# Patient Record
Sex: Female | Born: 1975 | Race: Black or African American | Hispanic: No | Smoking: Never smoker
Health system: Southern US, Community
[De-identification: ages and names within clinical notes are randomized; demographics above are authoritative.]

## PROBLEM LIST (undated history)

## (undated) DIAGNOSIS — D649 Anemia, unspecified: Secondary | ICD-10-CM

## (undated) HISTORY — PX: DILATION AND CURETTAGE, DIAGNOSTIC / THERAPEUTIC: SUR384

## (undated) HISTORY — DX: Anemia, unspecified: D64.9

---

## 2003-03-20 HISTORY — PX: TUBAL LIGATION: SHX77

## 2005-03-19 ENCOUNTER — Encounter (INDEPENDENT_AMBULATORY_CARE_PROVIDER_SITE_OTHER): Payer: Self-pay | Admitting: Internal Medicine

## 2005-03-19 LAB — CONVERTED CEMR LAB

## 2005-05-26 ENCOUNTER — Inpatient Hospital Stay (HOSPITAL_COMMUNITY): Admission: AD | Admit: 2005-05-26 | Discharge: 2005-05-26 | Payer: Self-pay | Admitting: Obstetrics and Gynecology

## 2005-05-29 ENCOUNTER — Inpatient Hospital Stay (HOSPITAL_COMMUNITY): Admission: AD | Admit: 2005-05-29 | Discharge: 2005-05-29 | Payer: Self-pay | Admitting: Obstetrics and Gynecology

## 2005-11-02 ENCOUNTER — Inpatient Hospital Stay (HOSPITAL_COMMUNITY): Admission: AD | Admit: 2005-11-02 | Discharge: 2005-11-02 | Payer: Self-pay | Admitting: Family Medicine

## 2006-04-03 ENCOUNTER — Emergency Department (HOSPITAL_COMMUNITY): Admission: EM | Admit: 2006-04-03 | Discharge: 2006-04-03 | Payer: Self-pay | Admitting: Emergency Medicine

## 2006-05-31 ENCOUNTER — Ambulatory Visit: Payer: Self-pay | Admitting: Internal Medicine

## 2006-06-14 ENCOUNTER — Ambulatory Visit: Payer: Self-pay | Admitting: Internal Medicine

## 2006-07-15 ENCOUNTER — Ambulatory Visit: Payer: Self-pay | Admitting: Internal Medicine

## 2006-09-24 ENCOUNTER — Inpatient Hospital Stay (HOSPITAL_COMMUNITY): Admission: AD | Admit: 2006-09-24 | Discharge: 2006-09-24 | Payer: Self-pay | Admitting: Obstetrics & Gynecology

## 2006-09-27 ENCOUNTER — Ambulatory Visit: Payer: Self-pay | Admitting: Internal Medicine

## 2006-11-27 ENCOUNTER — Encounter (INDEPENDENT_AMBULATORY_CARE_PROVIDER_SITE_OTHER): Payer: Self-pay | Admitting: Internal Medicine

## 2006-11-27 DIAGNOSIS — G44209 Tension-type headache, unspecified, not intractable: Secondary | ICD-10-CM

## 2006-11-27 DIAGNOSIS — N760 Acute vaginitis: Secondary | ICD-10-CM | POA: Insufficient documentation

## 2006-11-27 DIAGNOSIS — N920 Excessive and frequent menstruation with regular cycle: Secondary | ICD-10-CM | POA: Insufficient documentation

## 2006-11-27 DIAGNOSIS — Z8619 Personal history of other infectious and parasitic diseases: Secondary | ICD-10-CM | POA: Insufficient documentation

## 2006-11-27 DIAGNOSIS — N946 Dysmenorrhea, unspecified: Secondary | ICD-10-CM | POA: Insufficient documentation

## 2007-01-03 ENCOUNTER — Telehealth (INDEPENDENT_AMBULATORY_CARE_PROVIDER_SITE_OTHER): Payer: Self-pay | Admitting: *Deleted

## 2007-09-10 ENCOUNTER — Inpatient Hospital Stay (HOSPITAL_COMMUNITY): Admission: AD | Admit: 2007-09-10 | Discharge: 2007-09-10 | Payer: Self-pay | Admitting: Obstetrics & Gynecology

## 2009-02-17 ENCOUNTER — Emergency Department (HOSPITAL_COMMUNITY): Admission: EM | Admit: 2009-02-17 | Discharge: 2009-02-17 | Payer: Self-pay | Admitting: Emergency Medicine

## 2009-06-23 IMAGING — US US PELVIS COMPLETE MODIFY
1 series · 13 of 25 positions shown · non-contrast
Comparison: none

CLINICAL DATA: Left lower quadrant pain and back pain. 
 TRANSABDOMINAL AND TRANSVAGINAL PELVIC ULTRASOUND:
TECHNIQUE: Both transabdominal and transvaginal ultrasound examinations of the pelvis were performed including evaluation of the uterus, ovaries, adnexal regions, and pelvic cul-de-sac.
TECHNIQUE: Complete ultrasound examination of the urinary tract was performed including evaluation of the kidneys, renal collecting systems, and urinary bladder.

[Series 1: us pelvis complete modify · 0.16mm/px · 13 of 30 slices shown]
[im 1/30]
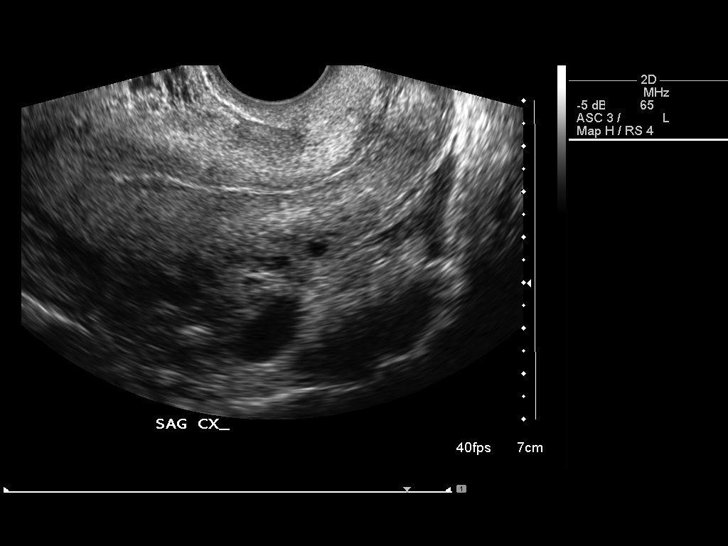
[im 3/30]
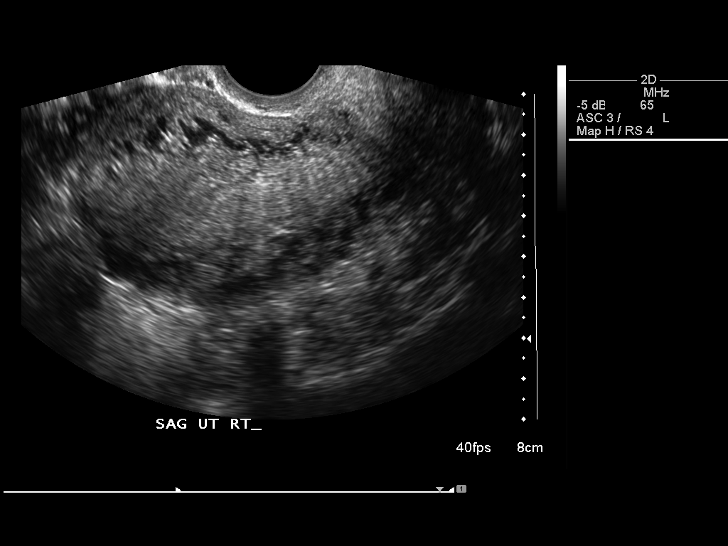
[im 5/30]
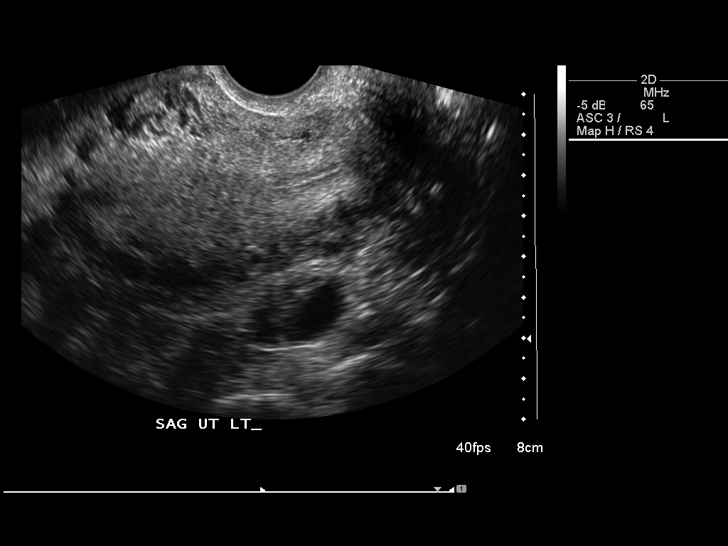
[im 8/30]
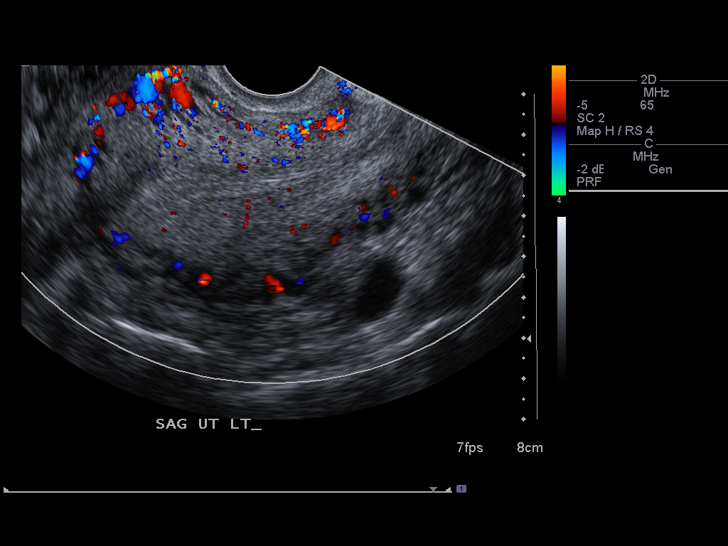
[im 10/30]
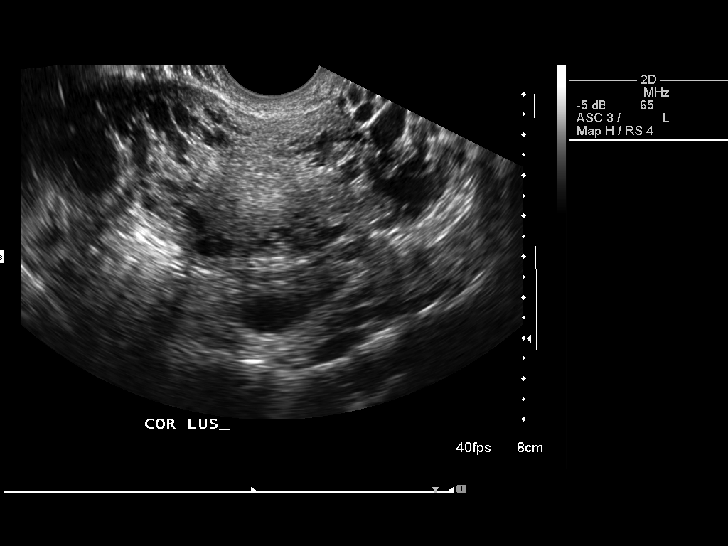
[im 13/30]
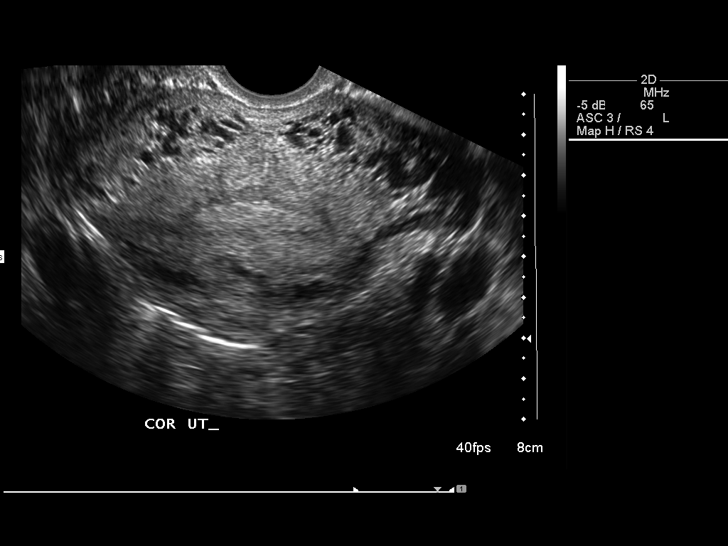
[im 15/30]
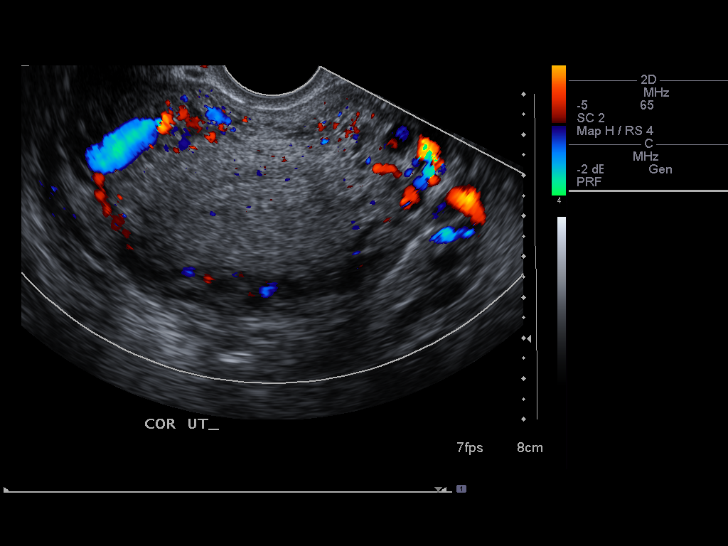
[im 17/30]
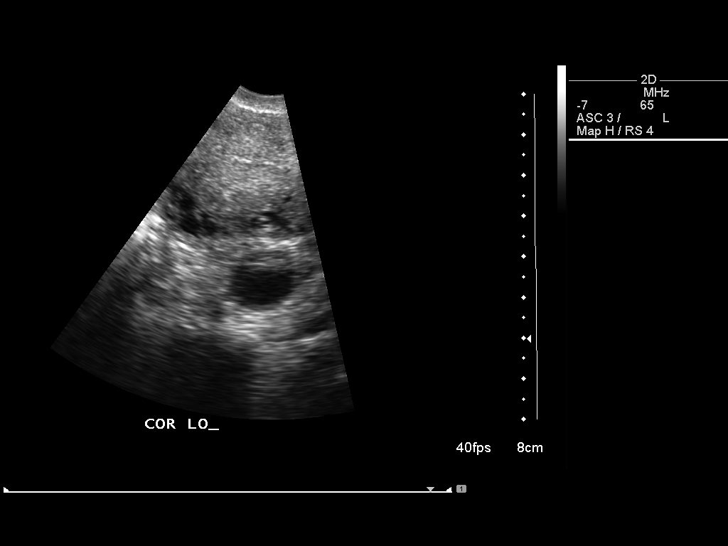
[im 20/30]
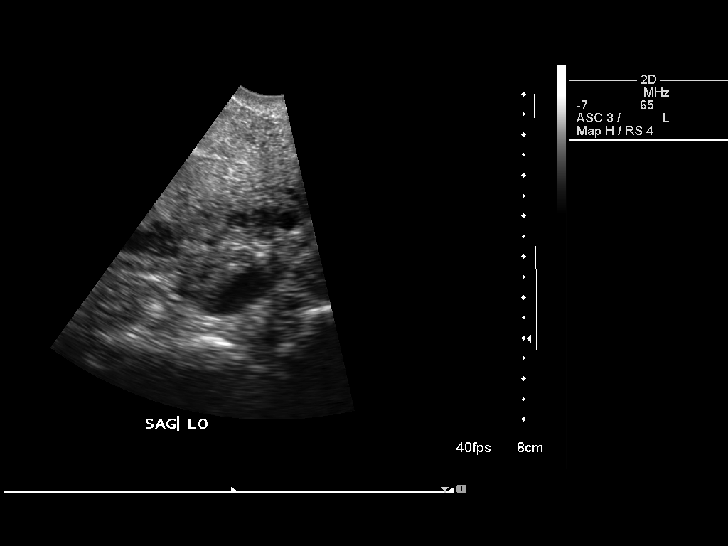
[im 22/30]
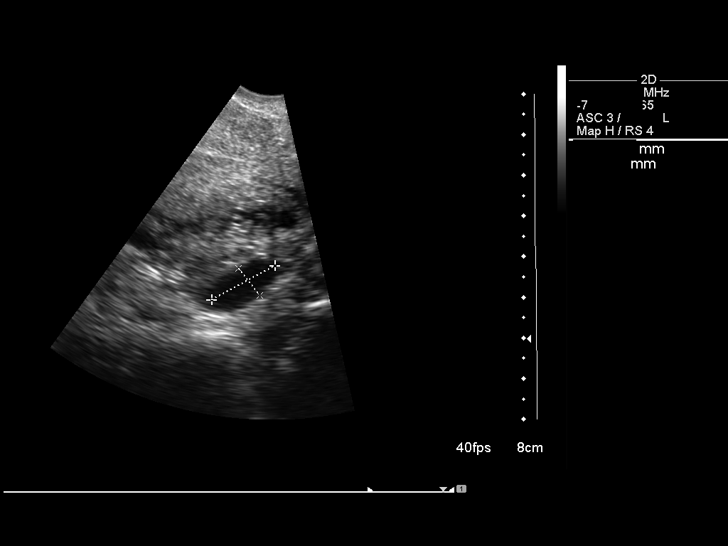
[im 25/30]
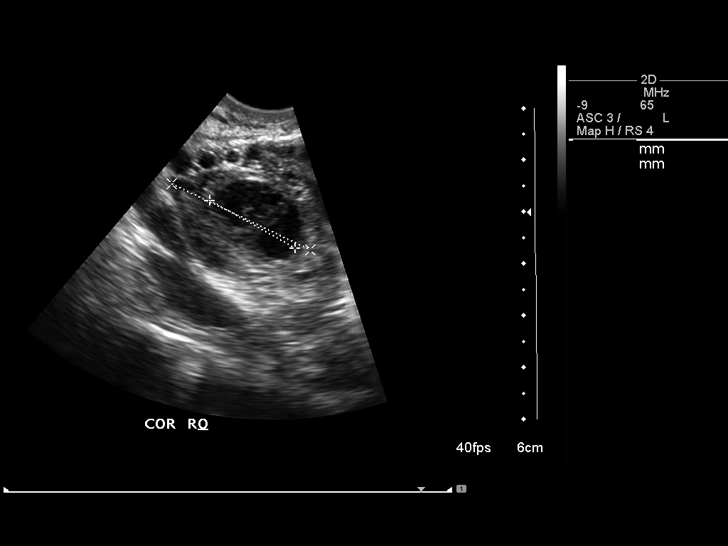
[im 27/30]
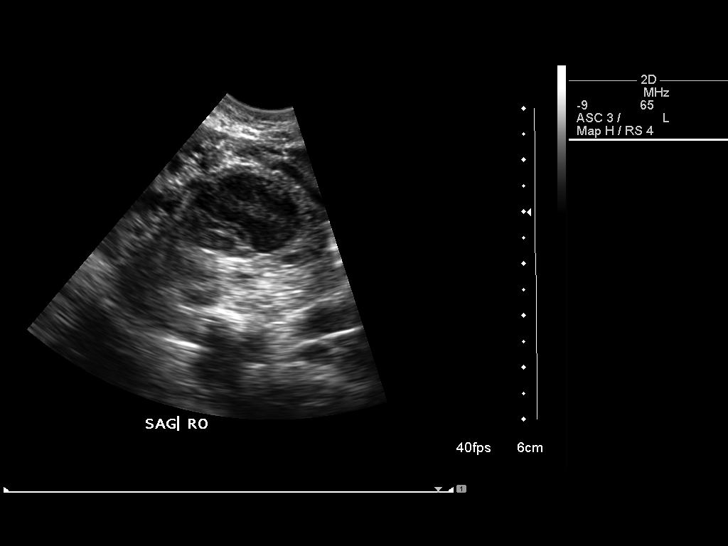
[im 30/30]
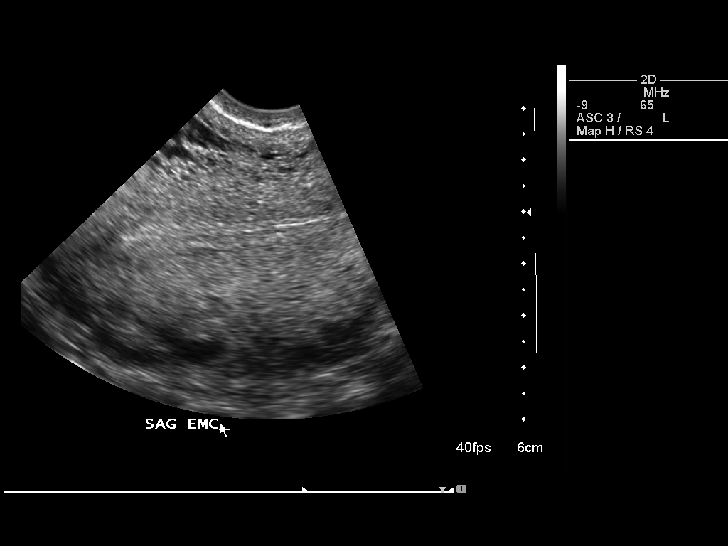

[13 of 25 positions shown; findings below may reference images not displayed]

FINDINGS: The uterus has a normal size, shape, and echotexture. The uterine dimensions are 11.1 x 6.4 x 8.4 cm.  The endometrium is homogeneous and within normal limits in width measuring 10-11 mm.  Both ovaries have a normal size, shape, and appearance. The right ovary measures 3.8 x 2.4 x 3.0 cm, and the left ovary measures 2.4 x 1.6 x 1.8 cm.  A 2 cm hemorrhagic cyst is within normal limits on the right ovary. There is no free pelvic fluid.
IMPRESSION: Normal pelvic ultrasound. 
 RENAL/URINARY TRACT ULTRASOUND:
FINDINGS: Both kidneys have a normal size, shape, and echotexture. No shadowing renal calculi, hydronephrosis, or renal masses are identified bilaterally.  The right kidney measures 10.9 cm in length and the left kidney measures 11.0 cm in length. The urinary bladder has an unremarkable sonographic appearance.
IMPRESSION: Normal renal ultrasound.

## 2010-12-14 LAB — POCT PREGNANCY, URINE
Operator id: 28886
Preg Test, Ur: NEGATIVE

## 2010-12-14 LAB — URINALYSIS, ROUTINE W REFLEX MICROSCOPIC
Nitrite: NEGATIVE
Protein, ur: 30 — AB
Specific Gravity, Urine: 1.02

## 2010-12-14 LAB — URINE MICROSCOPIC-ADD ON

## 2011-01-02 LAB — WET PREP, GENITAL: Yeast Wet Prep HPF POC: NONE SEEN

## 2011-01-02 LAB — COMPREHENSIVE METABOLIC PANEL
ALT: 16
AST: 21
Albumin: 3.8
CO2: 27
GFR calc non Af Amer: >60
Glucose, Bld: 110 — ABNORMAL HIGH
Sodium: 137

## 2011-01-02 LAB — URINALYSIS, ROUTINE W REFLEX MICROSCOPIC
Hgb urine dipstick: NEGATIVE
Nitrite: NEGATIVE
Protein, ur: NEGATIVE
Specific Gravity, Urine: 1.025
pH: 6

## 2011-01-02 LAB — POCT PREGNANCY, URINE
Operator id: 220991
Preg Test, Ur: NEGATIVE

## 2011-01-02 LAB — CBC
HCT: 35.3 — ABNORMAL LOW
MCHC: 33.3
RDW: 13.7

## 2011-05-27 ENCOUNTER — Emergency Department (HOSPITAL_COMMUNITY)
Admission: EM | Admit: 2011-05-27 | Discharge: 2011-05-27 | Disposition: A | Discharge status: Home / Self Care | Payer: Self-pay | Attending: Emergency Medicine | Admitting: Emergency Medicine

## 2011-05-27 ENCOUNTER — Encounter (HOSPITAL_COMMUNITY): Payer: Self-pay | Admitting: Emergency Medicine

## 2011-05-27 DIAGNOSIS — R350 Frequency of micturition: Secondary | ICD-10-CM | POA: Insufficient documentation

## 2011-05-27 DIAGNOSIS — N390 Urinary tract infection, site not specified: Secondary | ICD-10-CM | POA: Insufficient documentation

## 2011-05-27 DIAGNOSIS — R300 Dysuria: Secondary | ICD-10-CM | POA: Insufficient documentation

## 2011-05-27 LAB — URINALYSIS, ROUTINE W REFLEX MICROSCOPIC
Bilirubin Urine: NEGATIVE
Glucose, UA: NEGATIVE mg/dL
Ketones, ur: NEGATIVE mg/dL
Protein, ur: 30 mg/dL — AB
Specific Gravity, Urine: 1.025 (ref 1.005–1.030)
pH: 6 (ref 5.0–8.0)

## 2011-05-27 LAB — URINE MICROSCOPIC-ADD ON

## 2011-05-27 LAB — POCT PREGNANCY, URINE: Preg Test, Ur: NEGATIVE

## 2011-05-27 MED ORDER — IBUPROFEN 800 MG PO TABS
800.0000 mg | ORAL_TABLET | Freq: Once | ORAL | Status: AC
  Administered 2011-05-27: 800 mg via ORAL
  Filled 2011-05-27: qty 1

## 2011-05-27 MED ORDER — CEFIXIME 400 MG PO TABS
400.0000 mg | ORAL_TABLET | Freq: Once | ORAL | Status: AC
  Administered 2011-05-27: 400 mg via ORAL
  Filled 2011-05-27: qty 1

## 2011-05-27 MED ORDER — PHENAZOPYRIDINE HCL 100 MG PO TABS
100.0000 mg | ORAL_TABLET | Freq: Once | ORAL | Status: AC
  Administered 2011-05-27: 100 mg via ORAL
  Filled 2011-05-27: qty 1

## 2011-05-27 MED ORDER — CEPHALEXIN 500 MG PO CAPS: 500.0000 mg | ORAL_CAPSULE | Freq: Four times a day (QID) | ORAL | Status: AC

## 2011-05-27 MED ORDER — ONDANSETRON HCL 4 MG PO TABS
4.0000 mg | ORAL_TABLET | Freq: Once | ORAL | Status: AC
  Administered 2011-05-27: 4 mg via ORAL
  Filled 2011-05-27: qty 1

## 2011-05-27 NOTE — ED Notes (Signed)
Patient with no complaints at this time. Respirations even and unlabored. Skin warm/dry. Discharge instructions reviewed with patient at this time. Patient given opportunity to voice concerns/ask questions. Patient discharged at this time and left Emergency Department with steady gait.   

## 2011-05-27 NOTE — ED Notes (Signed)
Patient with c/o urinary pain and frequency for past several days. Denies abdominal pain.

## 2011-05-27 NOTE — ED Provider Notes (Signed)
History     CSN: [id number]  Arrival date & time 05/27/11  0909   First MD Initiated Contact with Patient 05/27/11 [id number]      Chief Complaint  Patient presents with  . Urinary Frequency  . Dysuria    (Consider location/radiation/quality/duration/timing/severity/associated sxs/prior treatment) Patient is a 36 y.o. female presenting with frequency and dysuria. The history is provided by the patient.  Urinary Frequency This is a new problem. The current episode started in the past 7 days. The problem occurs daily. The problem has been gradually worsening. Pertinent negatives include no abdominal pain, arthralgias, chest pain, chills, coughing, fever, neck pain or vomiting. Associated symptoms comments: dysuria. The symptoms are aggravated by nothing. Treatments tried: azo standard. The treatment provided no relief.  Dysuria  Associated symptoms include frequency. Pertinent negatives include no chills, no vomiting and no hematuria.    History reviewed. No pertinent past medical history.  Past Surgical History  Procedure Date  . Dilation and curettage, diagnostic / therapeutic     No family history on file.  History  Substance Use Topics  . Smoking status: Current Some Day Smoker    Types: Cigarettes  . Smokeless tobacco: Not on file  . Alcohol Use: No    OB History    Grav Para Term Preterm Abortions TAB SAB Ect Mult Living                  Review of Systems  Constitutional: Negative for fever, chills and activity change.       All ROS Neg except as noted in HPI  HENT: Negative for nosebleeds and neck pain.   Eyes: Negative for photophobia and discharge.  Respiratory: Negative for cough, shortness of breath and wheezing.   Cardiovascular: Negative for chest pain and palpitations.  Gastrointestinal: Negative for vomiting, abdominal pain and blood in stool.  Genitourinary: Positive for dysuria and frequency. Negative for hematuria.  Musculoskeletal: Negative for back  pain and arthralgias.  Skin: Negative.   Neurological: Negative for dizziness, seizures and speech difficulty.  Psychiatric/Behavioral: Negative for hallucinations and confusion.    Allergies  Review of patient's allergies indicates no known allergies.  Home Medications   Current Outpatient Rx  Name Route Sig Dispense Refill  . IBUPROFEN 200 MG PO TABS Oral Take 400 mg by mouth every 6 (six) hours as needed. For pain    . CEPHALEXIN 500 MG PO CAPS Oral Take 1 capsule (500 mg total) by mouth 4 (four) times daily. 28 capsule 0    BP 111/73  Pulse 81  Temp(Src) 97.9 F (36.6 C) (Oral)  Resp 16  Ht 5\' 9"  (1.753 m)  Wt 143 lb (64.864 kg)  BMI 21.12 kg/m2  SpO2 99%  LMP 04/27/2011  Physical Exam  Nursing note and vitals reviewed. Constitutional: She is oriented to person, place, and time. She appears well-developed and well-nourished.  Non-toxic appearance.  HENT:  Head: Normocephalic.  Right Ear: Tympanic membrane and external ear normal.  Left Ear: Tympanic membrane and external ear normal.  Eyes: EOM and lids are normal. Pupils are equal, round, and reactive to light.  Neck: Normal range of motion. Neck supple. Carotid bruit is not present.  Cardiovascular: Normal rate, regular rhythm, normal heart sounds, intact distal pulses and normal pulses.   Pulmonary/Chest: Breath sounds normal. No respiratory distress.  Abdominal: Soft. Bowel sounds are normal. There is no tenderness. There is no guarding.       No CV A tenderness.  Musculoskeletal: Normal  range of motion.  Lymphadenopathy:       Head (right side): No submandibular adenopathy present.       Head (left side): No submandibular adenopathy present.    She has no cervical adenopathy.  Neurological: She is alert and oriented to person, place, and time. She has normal strength. No cranial nerve deficit or sensory deficit.  Skin: Skin is warm and dry.  Psychiatric: She has a normal mood and affect. Her speech is normal.      ED Course  Procedures (including critical care time)  Labs Reviewed  URINALYSIS, ROUTINE W REFLEX MICROSCOPIC - Abnormal; Notable for the following:    APPearance CLOUDY (*)    Hgb urine dipstick LARGE (*)    Protein, ur 30 (*)    Leukocytes, UA SMALL (*)    All other components within normal limits  URINE MICROSCOPIC-ADD ON - Abnormal; Notable for the following:    Squamous Epithelial / LPF FEW (*)    Bacteria, UA MANY (*)    All other components within normal limits  POCT PREGNANCY, URINE  URINE CULTURE   No results found.   1. UTI (lower urinary tract infection)       MDM  I have reviewed nursing notes, vital signs, and all appropriate lab and imaging results for this patient. Urinalysis reveals a urinary tract infection. Vital signs are well within normal limits. Vision is treated with cephalexin 500 mg 4 times daily for 7 days. Patient is to have her urine rechecked by the M.D. At the health department in 7-10 days. Patient invited to return to the emergency department if any changes, problems, or concerns.       Kathie Dike, [place] 05/27/11 1119

## 2011-05-27 NOTE — ED Notes (Signed)
uti symptoms for the past few days, has tried azo otc with minimal relief, pt states that the symptoms became worse this am.

## 2011-05-27 NOTE — ED Provider Notes (Signed)
Medical screening examination/treatment/procedure(s) were performed by non-physician practitioner and as supervising physician I was immediately available for consultation/collaboration. Devoria Albe, MD, Armando Gang   Ward Givens, MD 05/27/11 1630

## 2011-05-27 NOTE — Discharge Instructions (Signed)
Your test indicate you have a urinary tract infection. Please use Keflex 4 times daily with food until all taken. Please continue your AZO medication. Please use 3-4 tablets of ibuprofen 3 times daily with food. Please have your urine rechecked at the health department in 7-10 days.Urinary Tract Infection A urinary tract infection (UTI) is often caused by a germ (bacteria). A UTI is usually helped with medicine (antibiotics) that kills germs. Take all the medicine until it is gone. Do this even if you are feeling better. You are usually better in 7 to 10 days. HOME CARE   Drink enough water and fluids to keep your pee (urine) clear or pale yellow. Drink:   Cranberry juice.   Water.   Avoid:   Caffeine.   Tea.   Bubbly (carbonated) drinks.   Alcohol.   Only take medicine as told by your doctor.   To prevent further infections:   Pee often.   After pooping (bowel movement), women should wipe from front to back. Use each tissue only once.   Pee before and after having sex (intercourse).  Ask your doctor when your test results will be ready. Make sure you follow up and get your test results.  GET HELP RIGHT AWAY IF:   There is very bad back pain or lower belly (abdominal) pain.   You get the chills.   You have a fever.   Your baby is older than 3 months with a rectal temperature of 102 F (38.9 C) or higher.   Your baby is 60 months old or younger with a rectal temperature of 100.4 F (38 C) or higher.   You feel sick to your stomach (nauseous) or throw up (vomit).   There is continued burning with peeing.   Your problems are not better in 3 days. Return sooner if you are getting worse.  MAKE SURE YOU:   Understand these instructions.   Will watch your condition.   Will get help right away if you are not doing well or get worse.  Document Released: 08/22/2007 Document Revised: 02/22/2011 Document Reviewed: 08/22/2007 [facility] Patient Information 2012 [place],  [place].

## 2011-05-29 LAB — URINE CULTURE: Culture  Setup Time: 201303102116

## 2011-05-30 NOTE — ED Notes (Signed)
+   Urine Patient treated with Keflex-sensitive to same-chart appended per protocol MD. 

## 2019-10-20 ENCOUNTER — Ambulatory Visit
Admission: RE | Admit: 2019-10-20 | Discharge: 2019-10-20 | Disposition: A | Discharge status: Home / Self Care | Payer: Self-pay | Source: Ambulatory Visit | Attending: Physician Assistant | Admitting: Physician Assistant

## 2019-10-20 ENCOUNTER — Other Ambulatory Visit: Payer: Self-pay

## 2019-10-20 VITALS — BP 125/84 | HR 94 | Temp 98.5°F | Resp 18

## 2019-10-20 DIAGNOSIS — R05 Cough: Secondary | ICD-10-CM

## 2019-10-20 DIAGNOSIS — J3489 Other specified disorders of nose and nasal sinuses: Secondary | ICD-10-CM

## 2019-10-20 DIAGNOSIS — J029 Acute pharyngitis, unspecified: Secondary | ICD-10-CM

## 2019-10-20 DIAGNOSIS — Z1152 Encounter for screening for COVID-19: Secondary | ICD-10-CM

## 2019-10-20 DIAGNOSIS — R059 Cough, unspecified: Secondary | ICD-10-CM

## 2019-10-20 DIAGNOSIS — R0981 Nasal congestion: Secondary | ICD-10-CM

## 2019-10-20 MED ORDER — FLUTICASONE PROPIONATE 50 MCG/ACT NA SUSP
2.0000 | Freq: Every day | NASAL | 0 refills | Status: AC
Start: 2019-10-20 — End: ?

## 2019-10-20 MED ORDER — BENZONATATE 200 MG PO CAPS: 200.0000 mg | ORAL_CAPSULE | Freq: Three times a day (TID) | ORAL | 0 refills | Status: AC

## 2019-10-20 MED ORDER — IPRATROPIUM BROMIDE 0.06 % NA SOLN: 2.0000 | Freq: Four times a day (QID) | NASAL | 0 refills | Status: AC

## 2019-10-20 NOTE — Discharge Instructions (Signed)
COVID PCR testing ordered. I would like you to quarantine until testing results. Tessalon for cough. Start flonase, atrovent nasal spray for nasal congestion/drainage. You can use over the counter nasal saline rinse such as neti pot for nasal congestion. Keep hydrated, your urine should be clear to pale yellow in color. Tylenol/motrin for fever and pain. Monitor for any worsening of symptoms, chest pain, shortness of breath, wheezing, swelling of the throat, go to the emergency department for further evaluation needed.   

## 2019-10-20 NOTE — ED Triage Notes (Signed)
Pt c/o cough, nasal congestion, ear pressure, sore throat, and headaches since Sunday night. States alka sezelter with no relief.

## 2019-10-20 NOTE — ED Provider Notes (Signed)
[facility]    CSN: [id number] Arrival date & time: 10/20/19  1052      History   Chief Complaint Chief Complaint  Patient presents with  . Cough    HPI Emily Glass is a 44 y.o. female.   44 year old female comes in for 3 day of URI symptoms. Cough, nasal congestion, ear pressure, sore throat, headaches. Denies fever, chills, body aches. Denies abdominal pain, nausea, vomiting, diarrhea. Denies shortness of breath, loss of taste/smell. No known COVID exposures. No COVID vaccines.      History reviewed. No pertinent past medical history.  Patient Active Problem List   Diagnosis Date Noted  . TENSION HEADACHE 11/27/2006  . BACTERIAL VAGINITIS 11/27/2006  . DYSMENORRHEA 11/27/2006  . MENORRHAGIA 11/27/2006  . CHLAMYDIAL INFECTION, HX OF 11/27/2006    Past Surgical History:  Procedure Laterality Date  . DILATION AND CURETTAGE, DIAGNOSTIC / THERAPEUTIC      OB History   No obstetric history on file.      Home Medications    Prior to Admission medications   Medication Sig Start Date End Date Taking? Authorizing Provider  benzonatate (TESSALON) 200 MG capsule Take 1 capsule (200 mg total) by mouth every 8 (eight) hours. 10/20/19   Cathie Hoops, Hank Walling V, PA-C  fluticasone (FLONASE) 50 MCG/ACT nasal spray Place 2 sprays into both nostrils daily. 10/20/19   Cathie Hoops, Brighid Koch V, PA-C  ibuprofen (ADVIL,MOTRIN) 200 MG tablet Take 400 mg by mouth every 6 (six) hours as needed. For pain    [provider]  ipratropium (ATROVENT) 0.06 % nasal spray Place 2 sprays into both nostrils 4 (four) times daily. 10/20/19   Belinda Fisher, PA-C    Family History History reviewed. No pertinent family history.  Social History Social History   Tobacco Use  . Smoking status: Never Smoker  . Smokeless tobacco: Never Used  Substance Use Topics  . Alcohol use: No  . Drug use: Not Currently    Types: Marijuana     Allergies   Patient has no known allergies.   Review of  Systems Review of Systems  Reason unable to perform ROS: See HPI as above.     Physical Exam Triage Vital Signs ED Triage Vitals  Enc Vitals Group     BP 10/20/19 1106 125/84     Pulse Rate 10/20/19 1106 94     Resp 10/20/19 1106 18     Temp 10/20/19 1106 98.5 F (36.9 C)     Temp Source 10/20/19 1106 Oral     SpO2 10/20/19 1106 100 %     Weight --      Height --      Head Circumference --      Peak Flow --      Pain Score 10/20/19 1123 0     Pain Loc --      Pain Edu? --      Excl. in GC? --    No data found.  Updated Vital Signs BP 125/84 (BP Location: Right Arm)   Pulse 94   Temp 98.5 F (36.9 C) (Oral)   Resp 18   LMP 09/26/2019   SpO2 100%   Physical Exam Constitutional:      General: She is not in acute distress.    Appearance: Normal appearance. She is well-developed. She is not ill-appearing, toxic-appearing or diaphoretic.  HENT:     Head: Normocephalic and atraumatic.     Right Ear: Tympanic membrane, ear  canal and external ear normal. Tympanic membrane is not erythematous or bulging.     Left Ear: Tympanic membrane, ear canal and external ear normal. Tympanic membrane is not erythematous or bulging.     Nose:     Right Sinus: Maxillary sinus tenderness and frontal sinus tenderness present.     Left Sinus: Maxillary sinus tenderness and frontal sinus tenderness present.     Mouth/Throat:     Mouth: Mucous membranes are moist.     Pharynx: Oropharynx is clear. Uvula midline.  Eyes:     Conjunctiva/sclera: Conjunctivae normal.     Pupils: Pupils are equal, round, and reactive to light.  Cardiovascular:     Rate and Rhythm: Normal rate and regular rhythm.  Pulmonary:     Effort: Pulmonary effort is normal. No accessory muscle usage, prolonged expiration, respiratory distress or retractions.     Breath sounds: No decreased air movement or transmitted upper airway sounds. No decreased breath sounds.     Comments: LCTAB Musculoskeletal:     Cervical  back: Normal range of motion and neck supple.  Skin:    General: Skin is warm and dry.  Neurological:     Mental Status: She is alert and oriented to person, place, and time.      UC Treatments / Results  Labs (all labs ordered are listed, but only abnormal results are displayed) Labs Reviewed  NOVEL CORONAVIRUS, NAA    EKG   Radiology No results found.  Procedures Procedures (including critical care time)  Medications Ordered in UC Medications - No data to display  Initial Impression / Assessment and Plan / UC Course  I have reviewed the triage vital signs and the nursing notes.  Pertinent labs & imaging results that were available during my care of the patient were reviewed by me and considered in my medical decision making (see chart for details).    COVID PCR test ordered. Patient to quarantine until testing results return. No alarming signs on exam.  LCTAB. Symptomatic treatment discussed.  Push fluids.  Return precautions given.  Patient expresses understanding and agrees to plan.  Final Clinical Impressions(s) / UC Diagnoses   Final diagnoses:  Encounter for screening for COVID-19  Cough  Sore throat  Nasal congestion  Sinus pressure    ED Prescriptions    Medication Sig Dispense Auth. Provider   fluticasone (FLONASE) 50 MCG/ACT nasal spray Place 2 sprays into both nostrils daily. 1 g Saleema Weppler V, PA-C   ipratropium (ATROVENT) 0.06 % nasal spray Place 2 sprays into both nostrils 4 (four) times daily. 15 mL Zonya Gudger V, PA-C   benzonatate (TESSALON) 200 MG capsule Take 1 capsule (200 mg total) by mouth every 8 (eight) hours. 21 capsule Belinda Fisher, PA-C     PDMP not reviewed this encounter.   Belinda Fisher, PA-C 10/20/19 1200

## 2019-10-21 LAB — SARS-COV-2, NAA 2 DAY TAT

## 2019-10-21 LAB — NOVEL CORONAVIRUS, NAA: SARS-CoV-2, NAA: NOT DETECTED

## 2021-04-20 ENCOUNTER — Emergency Department (HOSPITAL_BASED_OUTPATIENT_CLINIC_OR_DEPARTMENT_OTHER)
Admission: EM | Admit: 2021-04-20 | Discharge: 2021-04-20 | Disposition: A | Discharge status: Home / Self Care | Payer: 59 | Attending: Emergency Medicine | Admitting: Emergency Medicine

## 2021-04-20 ENCOUNTER — Encounter (HOSPITAL_BASED_OUTPATIENT_CLINIC_OR_DEPARTMENT_OTHER): Payer: Self-pay | Admitting: Emergency Medicine

## 2021-04-20 ENCOUNTER — Other Ambulatory Visit: Payer: Self-pay

## 2021-04-20 DIAGNOSIS — Z853 Personal history of malignant neoplasm of breast: Secondary | ICD-10-CM | POA: Diagnosis not present

## 2021-04-20 DIAGNOSIS — N6341 Unspecified lump in right breast, subareolar: Secondary | ICD-10-CM | POA: Insufficient documentation

## 2021-04-20 DIAGNOSIS — N644 Mastodynia: Secondary | ICD-10-CM | POA: Diagnosis not present

## 2021-04-20 NOTE — Discharge Instructions (Addendum)
You are seen in the emergency department for a mass under your areola.  Please call the breast clinic in the morning to set up an appointment this week to have imaging done. I have also placed a referral for them to call you. Additionally I have provided you with the [facility] phone to call and schedule a new patient visit.  You may also use another provider under your insurance.

## 2021-04-20 NOTE — ED Notes (Signed)
Chaperoned bilateral breast exam performed by Mertha Baars, PA-C.

## 2021-04-20 NOTE — ED Provider Notes (Signed)
[facility] Provider Note   CSN: [id number] Arrival date & time: 04/20/21  1719     History  Chief Complaint  Patient presents with   Breast Pain    Emily Glass is a 46 y.o. female.  With no significant past medical history who presents to the emergency department for concern of mass of her right breast.  Patient states that 2 weeks ago she was getting off of work when she noticed that her areola was swollen.  She states that there was redness and swelling however she figured it was from friction within her bra while she was working.  She states that as days went by the swelling decreased however she started noticing a lump behind her nipple.  She states that the swelling has resolved however she continues to have the lump which is slightly increased in size.  She states that it is "irritating and has a kind of burning sensation."  She denies fevers, nausea or vomiting, nipple discharge, skin changes to the breast, changes to the nipple such as inversion, weight loss, evidence of inflammatory breast cancer such as dry skin to the areola, further redness swelling or rapid changes to skin overlying the breast.  No history of breast cancer.  HPI     Home Medications Prior to Admission medications   Medication Sig Start Date End Date Taking? Authorizing Provider  benzonatate (TESSALON) 200 MG capsule Take 1 capsule (200 mg total) by mouth every 8 (eight) hours. 10/20/19   Cathie HoopsYu, Amy V, PA-C  fluticasone (FLONASE) 50 MCG/ACT nasal spray Place 2 sprays into both nostrils daily. 10/20/19   Cathie HoopsYu, Amy V, PA-C  ibuprofen (ADVIL,MOTRIN) 200 MG tablet Take 400 mg by mouth every 6 (six) hours as needed. For pain    [provider]  ipratropium (ATROVENT) 0.06 % nasal spray Place 2 sprays into both nostrils 4 (four) times daily. 10/20/19   Belinda FisherYu, Amy V, PA-C      Allergies    Patient has no known allergies.    Review of Systems   Review of Systems  Constitutional:   Negative for fever and unexpected weight change.  Gastrointestinal:  Negative for nausea.  Genitourinary:        Breast pain  All other systems reviewed and are negative.  Physical Exam Updated Vital Signs BP (!) 135/101    Pulse 88    Temp 98.7 F (37.1 C)    Resp (!) 21    Ht 5\' 9"  (1.753 m)    Wt 74.8 kg    LMP 04/06/2021    SpO2 100%    BMI 24.37 kg/m  Physical Exam Vitals and nursing note reviewed. Exam conducted with a chaperone present.  Constitutional:      General: She is not in acute distress.    Appearance: Normal appearance. She is not ill-appearing or toxic-appearing.  HENT:     Head: Normocephalic and atraumatic.  Eyes:     General: No scleral icterus.    Extraocular Movements: Extraocular movements intact.  Cardiovascular:     Pulses: Normal pulses.  Pulmonary:     Effort: Pulmonary effort is normal. No respiratory distress.  Chest:  Breasts:    Breasts are symmetrical.     Right: Mass and tenderness present. No inverted nipple, nipple discharge or skin change.     Left: Normal. No inverted nipple, mass, nipple discharge, skin change or tenderness.    Abdominal:     Palpations: Abdomen is soft.  Musculoskeletal:        General: Normal range of motion.     Cervical back: Neck supple.  Skin:    General: Skin is warm and dry.     Capillary Refill: Capillary refill takes less than 2 seconds.     Findings: No rash.  Neurological:     General: No focal deficit present.     Mental Status: She is alert and oriented to person, place, and time. Mental status is at baseline.  Psychiatric:        Mood and Affect: Mood normal.        Behavior: Behavior normal.        Thought Content: Thought content normal.        Judgment: Judgment normal.    ED Results / Procedures / Treatments   Labs (all labs ordered are listed, but only abnormal results are displayed) Labs Reviewed - No data to display  EKG None  Radiology No results found.  Procedures Procedures    Medications Ordered in ED Medications - No data to display  ED Course/ Medical Decision Making/ A&P                           Medical Decision Making Patient presents to the ED with complaints of breast mass. This involves an extensive number of treatment options, and is a complaint that carries with it a moderate risk of complications and morbidity.   Additional history obtained:  Additional history obtained from: Husband at bedside External records from outside source obtained and reviewed including: Previous ED visits  Tests Considered: Ultrasound right breast  ED Course: 46 year old female who presents to the emergency department with breast mass.  Breast exam performed with chaperone present.  The left breast is normal.  On evaluation of the breast there is no asymmetry.  On examination of the right breast there is a approximately 2 cm mass under the areola and nipple.  It is somewhat movable but firm.  There is no fluctuance or induration concerning for abscess.  There is no surrounding redness or cellulitis.  Unable to express any discharge from the nipple.  There are no red flags such as nipple inversion, skin changes, immovable, non-tender mass.  Will not explore further given the location of the mass.  Discussed need for follow-up at breast clinic.  She states that she presented to the emergency department in order to get referral to the breast clinic.  Considered ultrasound of the right breast however breast clinic has more advanced imaging which may be more appropriate for this patient.  Discussed that she should call them in the morning to have follow-up appointment.  Also placed ambulatory referral.  She is agreeable to this.  I have answered all of her husband and her's questions at this time.  No further work-up required at this time  After consideration of the diagnostic results and the patients response to treatment, I feel that the patent would benefit from discharge. The  patient has been appropriately medically screened and/or stabilized in the ED. I have low suspicion for any other emergent medical condition which would require further screening, evaluation or treatment in the ED or require inpatient management. The patient is overall well appearing and non-toxic in appearance. They are hemodynamically stable at time of discharge.   Final Clinical Impression(s) / ED Diagnoses Final diagnoses:  Subareolar mass of right breast    Rx / DC Orders ED Discharge Orders  Ordered    Ambulatory referral to Breast Clinic        04/20/21 2004              Lenard Galloway 04/21/21 1146    Tanda Rockers A, DO 04/21/21 2014

## 2021-04-20 NOTE — ED Triage Notes (Signed)
Pt arrives to ED with c/o breast pain. Pt reports she noticed a mass on here right breast x2 weeks ago. The mass is located behind her right nipple. She reports that the mass feels irregular, moveable, and is painful. Pt denies any nipple discharge.

## 2021-04-25 ENCOUNTER — Telehealth (HOSPITAL_BASED_OUTPATIENT_CLINIC_OR_DEPARTMENT_OTHER): Payer: Self-pay | Admitting: Student

## 2021-04-25 NOTE — ED Notes (Addendum)
Pt called requesting a referral to Breast Clinic in [place] instead of the breast clinic in [facility]. Waynard Edwards has agreed to look at pt's chart and make changes if possible

## 2021-04-25 NOTE — Telephone Encounter (Signed)
Telephone encounter created to place referral to the breast center in [place] as referral for subareolar right breast mass was accidentally placed to breast center in [facility] by initial ED provider who evaluated this patient.  I did not participate in her evaluation or treatment plan; my participation in her care is only to the extent to place correct ambulatory referral to [facility] breast center for further evaluation. I have personally reviewed this patient's encounter in the ED on 04/20/21, including documentation by ED provider that day.   This chart was dictated using voice recognition software, Dragon. Despite the best efforts of this provider to proofread and correct errors, errors may still occur which can change documentation meaning.

## 2021-08-02 ENCOUNTER — Ambulatory Visit: Payer: 59 | Admitting: Obstetrics and Gynecology

## 2021-08-02 ENCOUNTER — Other Ambulatory Visit: Payer: Self-pay | Admitting: Anesthesiology

## 2021-08-02 ENCOUNTER — Other Ambulatory Visit (HOSPITAL_COMMUNITY)
Admission: RE | Admit: 2021-08-02 | Discharge: 2021-08-02 | Disposition: A | Discharge status: Home / Self Care | Payer: 59 | Source: Ambulatory Visit | Attending: Obstetrics and Gynecology | Admitting: Obstetrics and Gynecology

## 2021-08-02 ENCOUNTER — Encounter: Payer: Self-pay | Admitting: Obstetrics and Gynecology

## 2021-08-02 VITALS — BP 120/74 | HR 87 | Wt 162.0 lb

## 2021-08-02 DIAGNOSIS — N63 Unspecified lump in unspecified breast: Secondary | ICD-10-CM | POA: Insufficient documentation

## 2021-08-02 DIAGNOSIS — R69 Illness, unspecified: Secondary | ICD-10-CM | POA: Diagnosis not present

## 2021-08-02 DIAGNOSIS — Z113 Encounter for screening for infections with a predominantly sexual mode of transmission: Secondary | ICD-10-CM | POA: Insufficient documentation

## 2021-08-02 DIAGNOSIS — Z01419 Encounter for gynecological examination (general) (routine) without abnormal findings: Secondary | ICD-10-CM

## 2021-08-02 DIAGNOSIS — N6310 Unspecified lump in the right breast, unspecified quadrant: Secondary | ICD-10-CM

## 2021-08-02 NOTE — Progress Notes (Signed)
Patient informed me that she has a "mass" in right breast. Patient stated that a provider from ED wants her to have an ultrasound.  ? ? ?

## 2021-08-02 NOTE — Progress Notes (Signed)
? ? ?GYNECOLOGY ANNUAL PREVENTATIVE CARE ENCOUNTER NOTE ? ?History:    ? Emily Glass is a 46 y.o. [id number] female here for a routine annual gynecologic exam.  Current complaints: right sided breast mass.   Denies abnormal vaginal bleeding, discharge, pelvic pain, problems with intercourse. ?Pt notes that she has had a right sided breast mass just below the nipple and areola that changes in size and discomfort depending on where she is in her cycle.  No obvious skin changes noted. ?  ?Gynecologic History ?Patient's last menstrual period was 07/18/2021 (approximate). ?Contraception: tubal ligation ?Last Pap: 2007. Results were: unretrieved ?Last mammogram: none listed. ?Obstetric History ?OB History  ?Gravida Para Term Preterm AB Living  ?6 5 5   1 5   ?SAB IAB Ectopic Multiple Live Births  ?1       5  ?  ?# Outcome Date GA Lbr Len/2nd Weight Sex Delivery Anes PTL Lv  ?6 Term           ?5 Term           ?4 Term           ?3 Term           ?2 Term           ?1 SAB           ? ? ?Past Medical History:  ?Diagnosis Date  ? Anemia   ? ? ?Past Surgical History:  ?Procedure Laterality Date  ? DILATION AND CURETTAGE, DIAGNOSTIC / THERAPEUTIC    ? TUBAL LIGATION  2005  ? ? ?Current Outpatient Medications on File Prior to Visit  ?Medication Sig Dispense Refill  ? ibuprofen (ADVIL,MOTRIN) 200 MG tablet Take 400 mg by mouth every 6 (six) hours as needed. For pain    ? benzonatate (TESSALON) 200 MG capsule Take 1 capsule (200 mg total) by mouth every 8 (eight) hours. (Patient not taking: Reported on 08/02/2021) 21 capsule 0  ? fluticasone (FLONASE) 50 MCG/ACT nasal spray Place 2 sprays into both nostrils daily. (Patient not taking: Reported on 08/02/2021) 1 g 0  ? ipratropium (ATROVENT) 0.06 % nasal spray Place 2 sprays into both nostrils 4 (four) times daily. (Patient not taking: Reported on 08/02/2021) 15 mL 0  ? ?No current facility-administered medications on file prior to visit.  ? ? ?No Known Allergies ? ?Social History:   reports that she has never smoked. She has never used smokeless tobacco. She reports current alcohol use. She reports that she does not currently use drugs. ? ?No family history on file. ? ?The following portions of the patient's history were reviewed and updated as appropriate: allergies, current medications, past family history, past medical history, past social history, past surgical history and problem list. ? ?Review of Systems ?Pertinent items noted in HPI and remainder of comprehensive ROS otherwise negative. ? ?Physical Exam:  ?BP 120/74   Pulse 87   Wt 162 lb (73.5 kg)   LMP 07/18/2021 (Approximate)   BMI 23.92 kg/m?  ?CONSTITUTIONAL: Well-developed, well-nourished female in no acute distress.  ?HENT:  Normocephalic, atraumatic, External right and left ear normal. Oropharynx is clear and moist ?EYES: Conjunctivae and EOM are normal.  ?NECK: Normal range of motion, supple, no masses.  Normal thyroid.  ?SKIN: Skin is warm and dry. No rash noted. Not diaphoretic. No erythema. No pallor. ?MUSCULOSKELETAL: Normal range of motion. No tenderness.  No cyanosis, clubbing, or edema.  2+ distal pulses. ?NEUROLOGIC: Alert and oriented to person, place,  and time. Normal reflexes, muscle tone coordination.  ?PSYCHIATRIC: Normal mood and affect. Normal behavior. Normal judgment and thought content. ?CARDIOVASCULAR: Normal heart rate noted, regular rhythm ?RESPIRATORY: Clear to auscultation bilaterally. Effort and breath sounds normal, no problems with respiration noted. ?BREASTS: Symmetric in size. No discrete mass detected in bilateral breasts.  No skin changes noted.  Performed in the presence of a chaperone. ?ABDOMEN: Soft, no distention noted.  No tenderness, rebound or guarding.  ?PELVIC: Normal appearing external genitalia and urethral meatus; normal appearing vaginal mucosa and cervix.  No abnormal discharge noted.  Pap smear obtained. STD swab taken. Normal uterine size, no other palpable masses, no uterine or  adnexal tenderness.  Performed in the presence of a chaperone. ?  ?Assessment and Plan:  ?  1. Encounter for well woman exam ?Normal annual exam ?- Cytology - PAP( [place]) ?- MM DIAG BREAST TOMO BILATERAL; Future ?- US BREAST LTD UNI RIGHT INC AXILLA; Future ? ?2. Routine screening for STI (sexually transmitted infection) ?Per pt request ?- Hepatitis B Surface AntiGEN ?- Hepatitis C Antibody ?- HIV Antibody (routine testing w rflx) ?- RPR ?- Cervicovaginal ancillary only( [place]) ? ?3. Women's annual routine gynecological examination ? ? ?4. Mass of right breast, unspecified quadrant ?Could not detect mass during exam, but was documented during ER eval.  Will schedule breast mammogram and ultrasound ? ?Will follow up results of pap smear and manage accordingly. ?Mammogram scheduled ?Routine preventative health maintenance measures emphasized. ?Please refer to After Visit Summary for other counseling recommendations.  ?   ? ?Mariel Aloe, MD, FACOG ?Obstetrician Heritage manager, Faculty Practice ?Center for [employer], [facility]  ?

## 2021-08-03 LAB — HIV ANTIBODY (ROUTINE TESTING W REFLEX): HIV Screen 4th Generation wRfx: NONREACTIVE

## 2021-08-03 LAB — HEPATITIS C ANTIBODY: Hep C Virus Ab: REACTIVE — AB

## 2021-08-03 LAB — RPR: RPR Ser Ql: NONREACTIVE

## 2021-08-03 LAB — HEPATITIS B SURFACE ANTIGEN: Hepatitis B Surface Ag: NEGATIVE

## 2021-08-04 LAB — CERVICOVAGINAL ANCILLARY ONLY
Chlamydia: NEGATIVE
Comment: NEGATIVE
Comment: NEGATIVE
Comment: NORMAL
Neisseria Gonorrhea: NEGATIVE
Trichomonas: POSITIVE — AB

## 2021-08-04 LAB — CYTOLOGY - PAP
Comment: NEGATIVE
Diagnosis: NEGATIVE
High risk HPV: NEGATIVE

## 2021-08-07 ENCOUNTER — Telehealth: Payer: Self-pay

## 2021-08-07 DIAGNOSIS — B1920 Unspecified viral hepatitis C without hepatic coma: Secondary | ICD-10-CM

## 2021-08-07 NOTE — Telephone Encounter (Signed)
Call placed to pt. No answer. Left VM to return call to office for results and treatment.  Referral placed per Dr Donavan Foil for ID for +Hep C. Emily Glass

## 2021-08-07 NOTE — Telephone Encounter (Signed)
-----   Message from Griffin Basil, MD sent at 08/07/2021  2:53 PM EDT ----- Hepatitis C noted from labs along with with trichomonas, will offer treatment for trich.  Will refer to infectious disease

## 2021-08-09 ENCOUNTER — Ambulatory Visit
Admission: RE | Admit: 2021-08-09 | Discharge: 2021-08-09 | Disposition: A | Discharge status: Home / Self Care | Payer: 59 | Source: Ambulatory Visit | Attending: Obstetrics and Gynecology | Admitting: Obstetrics and Gynecology

## 2021-08-09 DIAGNOSIS — R928 Other abnormal and inconclusive findings on diagnostic imaging of breast: Secondary | ICD-10-CM | POA: Diagnosis not present

## 2021-08-09 DIAGNOSIS — N6489 Other specified disorders of breast: Secondary | ICD-10-CM | POA: Diagnosis not present

## 2021-08-09 DIAGNOSIS — Z01419 Encounter for gynecological examination (general) (routine) without abnormal findings: Secondary | ICD-10-CM

## 2021-08-09 MED ORDER — METRONIDAZOLE 500 MG PO TABS: 500.0000 mg | ORAL_TABLET | Freq: Two times a day (BID) | ORAL | 0 refills | Status: AC

## 2021-08-09 NOTE — Telephone Encounter (Signed)
Call placed back to pt. Spoke with pt. Pt given results and recommendations per Dr Donavan Foil. Pt verbalized understanding.  Referral placed to ID per Dr Donavan Foil. Pt also given number to call for appt to ID.  Pt sent in Rx Flagyl for treatment of trich. Pt advised to take all medication with food and have partner treated at [facility] or PCP and no intercourse for 1-2 weeks after both partners treated. Pt agreeable to plan of care.  Judeth Cornfield, RN

## 2021-08-09 NOTE — Addendum Note (Signed)
Addended by: Georgia Lopes on: 08/09/2021 11:10 AM   Modules accepted: Orders

## 2021-08-11 ENCOUNTER — Telehealth: Payer: Self-pay

## 2021-08-11 ENCOUNTER — Other Ambulatory Visit (HOSPITAL_COMMUNITY): Payer: Self-pay

## 2021-08-11 NOTE — Telephone Encounter (Signed)
[facility] Patient Advocate Encounter  Insurance verification completed.    The patient is insured through [employer].  Medication will need a PA.  We will continue to follow to see if copay assistance is needed.  Gerlene Glassburn, CPhT [facility] Phone: [phone] Fax:  [phone]  

## 2021-08-16 ENCOUNTER — Ambulatory Visit (INDEPENDENT_AMBULATORY_CARE_PROVIDER_SITE_OTHER): Payer: 59 | Admitting: Infectious Diseases

## 2021-08-16 ENCOUNTER — Encounter: Payer: Self-pay | Admitting: Infectious Diseases

## 2021-08-16 ENCOUNTER — Other Ambulatory Visit: Payer: Self-pay

## 2021-08-16 VITALS — HR 67 | Temp 97.4°F | Ht 69.0 in | Wt 162.0 lb

## 2021-08-16 DIAGNOSIS — A5901 Trichomonal vulvovaginitis: Secondary | ICD-10-CM | POA: Diagnosis not present

## 2021-08-16 DIAGNOSIS — R768 Other specified abnormal immunological findings in serum: Secondary | ICD-10-CM | POA: Diagnosis not present

## 2021-08-16 DIAGNOSIS — R69 Illness, unspecified: Secondary | ICD-10-CM | POA: Diagnosis not present

## 2021-08-16 DIAGNOSIS — R7689 Other specified abnormal immunological findings in serum: Secondary | ICD-10-CM | POA: Insufficient documentation

## 2021-08-16 LAB — CBC: Hemoglobin: 9.3 g/dL — ABNORMAL LOW (ref 11.7–15.5)

## 2021-08-16 NOTE — Assessment & Plan Note (Signed)
Partner is getting treatment as well. Counseled to avoid sexual contact for 10-days after they both complete treatment to ensure not back and forth re-infection.  Would prefer the 500 mg BID for 7d course but if she anticipates trouble with missed doses she can do 2 gm (4 tablets) taken once.

## 2021-08-16 NOTE — Assessment & Plan Note (Signed)
Emily Glass is here with a reactive hepatitis C antibody blood test.  No recent risk factors that would support acute infection. Transmission risk could have been from sexual partner > 20 years ago or tattoo.   No reflex quantitative viral load done to confirm active infection. We discussed the differences between tests used to diagnose chronic infection.  Will check CMP, CBC and Hep C RNA quant and notify her of results and further recommendations.   HIV, RPR, Hep B sAg are all non-reactive.

## 2021-08-16 NOTE — Patient Instructions (Addendum)
Very nice to meet you today!  We are going to do some follow up blood work to see if you need treatment or not.    Your ANTIBODY test was reactive --> all this means is you have had an exposure at some point to hepatitis C. The confirmatory test is to check for a direct measurement of the viral load. See below for more information.    Will contact you when your results are back about next steps.    Hepatitis C Testing Why am I having this test? Hepatitis C testing is done to check for a liver infection caused by the hepatitis C virus (HCV). A person may have one or more hepatitis C tests done to: Help the health care provider diagnose HCV infection. This is if a person has possible signs of infection or has been exposed to HCV. Find the cause of long-term (chronic) liver disease or abnormal liver function test results. See if a person has had hepatitis C in the past. Hepatitis C is usually diagnosed with three blood tests: HCV antibody test. HCV RNA test. HCV genotype test. If a person is diagnosed with a current (active) HCV infection, he or she may have another test done to help monitor the condition during treatment. This test is called the quantitative HCV RNA test.  What is being tested? Each HCV test measures the amounts of different substances in your blood. Hepatitis C Antibody test checks for proteins that your body makes to fight HCV (antibodies). If you have antibodies to HCV, it means you have been infected with hepatitis C. It does not necessarily mean that you have an active infection. The HCV RNA test checks for genetic material from HCV. This test is done if your HCV antibody test is positive and your health care provider wants to find out if you have an active infection. The HCV genotype test. This test identifies the type (genotype) of virus you have. The quantitative HCV RNA test measures the amount of virus in your blood (viral load).  What kind of sample is  taken?  A blood sample is required for HCV tests. It is usually collected by inserting a needle into a vein in the arm.  How are the results reported? Anti-HCV test results are reported as either positive or negative for HCV antibodies. HCV RNA test results are reported as either positive or negative for HCV genetic material. HCV genotype test results are reported as which genotype of the virus you have. Genotypes are numbered 1 through 6. Quantitative HCV RNA test results are reported as a number that indicates your viral load. This is given as international units of virus per milliliter of blood (IU/mL). A result of 800,000 IU/mL or greater is considered a high viral load. A result of less than 800,000 IU/mL is considered a low viral load. Sometimes, results from the anti-HCV test or the HCV RNA test may report that: HCV antibodies or genetic material are present when they are not present (false-positive result). HCV antibodies or genetic material are not present when they are present (false-negative result).  What do the results mean? For the anti-HCV test: A negative result may mean that you have not been infected with HCV. You may need to have this test done again to confirm this result. A positive result may mean that you have an active HCV infection, or that you have been infected with HCV in the past. An HCV infection may not cause any symptoms, and your body  may get rid of the virus without treatment. For the HCV RNA test: A negative result means that you do not have an active HCV infection. A positive result means that you have an active HCV infection. For the HCV genotype test, knowing the specific genotype you have will help your health care provider recommend the treatment that will work best for you. The quantitative HCV RNA test gives your health care provider an idea of how well your treatment is working. If your viral load is high, you may need different treatment. If your  viral load is low, your treatment may be working effectively. You may have this test repeated to continue to monitor your treatment. Talk with your health care provider about what your results mean. Questions to ask your health care provider Ask your health care provider, or the department that is doing the test: When will my results be ready? How will I get my results? What are my treatment options? What other tests do I need? What are my next steps? Summary Hepatitis C testing is done to check for a liver infection caused by the hepatitis C virus (HCV). Hepatitis C is usually diagnosed with three blood tests and monitored with one test. A blood sample is required for these tests. It is usually collected by inserting a needle into a vein in the arm. Your test results for both the anti-HCV test and the HCV RNA test will be reported as either positive or negative. This information is not intended to replace advice given to you by your health care provider. Make sure you discuss any questions you have with your health care provider. Document Revised: 01/21/2020 Document Reviewed: 01/21/2020 Elsevier Patient Education  2023 [employer].

## 2021-08-16 NOTE — Progress Notes (Signed)
Patient Name: Emily Glass  Date of Birth: June 19, 1975  MRN: [id number]  PCP: Patient, No Pcp Per (Inactive)  Referring Provider: Griffin Basil, MD, Ph#: [phone]   CC:  New patient - initial evaluation of reactive hepatitis C antibody blood test.     HPI/ROS:  Emily Glass is a 46 y.o. female. No significant past medical history. 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 Recently experienced lump in breast that prompted GYN referral. Seen recently and underwent routine health screenings with reactive hepatitis C antibody test.  HIV, Hep BsAg and RPR were non-reactive.   She is not sure where she may have acquired the infection. Same sexual partner for 20 years now (he is also in the process of testing for HCV). Prior to that had other sexual partners that sound to have had some risk factors for hepatitis C. She has no history of drug use. Drinks some alcohol socially. Two tattoos that were professionally done (one at the age of 9 in 80) and second more recently over the last 10 years. No blood transfusions or health care exposures.   She has been reading up on hepatitis c and wonders whether a few of her symptoms are being caused by viral infection - had some random fevers/night sweats. Groin pain/muscle aches has noticed some numbness of upper arms. She is also working a more physical job and could attribute it to increased physical activity, but not sure what to make of the fever.  Has had lower leg pains over the last few weeks.   Patient does not have documented immunity to Hepatitis A. Patient does not have documented immunity to Hepatitis B.     Review of Systems  Constitutional:  Negative for appetite change, fatigue, fever and unexpected weight change.  Respiratory:  Negative for shortness of breath.   Cardiovascular:  Negative for chest pain and leg swelling.  Gastrointestinal:  Negative for abdominal  pain, blood in stool, nausea and vomiting.  Genitourinary:  Negative for difficulty urinating and hematuria.  Musculoskeletal:  Positive for myalgias. Negative for arthralgias.  Skin:  Negative for color change and rash.  Neurological:  Negative for dizziness, tremors and headaches.  Hematological:  Negative for adenopathy.  Psychiatric/Behavioral:  Negative for confusion.    All other systems reviewed and are negative        Past Medical History:  Diagnosis Date   Anemia     Prior to Admission medications   Medication Sig Start Date End Date Taking? Authorizing Provider  benzonatate (TESSALON) 200 MG capsule Take 1 capsule (200 mg total) by mouth every 8 (eight) hours. Patient not taking: Reported on 08/02/2021 10/20/19   Ok Edwards, PA-C  fluticasone [facility]) 50 MCG/ACT nasal spray Place 2 sprays into both nostrils daily. Patient not taking: Reported on 08/02/2021 10/20/19   Ok Edwards, PA-C  ibuprofen (ADVIL,MOTRIN) 200 MG tablet Take 400 mg by mouth every 6 (six) hours as needed. For pain    [provider]  ipratropium (ATROVENT) 0.06 % nasal spray Place 2 sprays into both nostrils 4 (four) times daily. Patient not taking: Reported on 08/02/2021 10/20/19   Ok Edwards, PA-C  metroNIDAZOLE (FLAGYL) 500 MG tablet Take 1 tablet (500 mg total) by mouth 2 (two) times daily. 08/09/21   Griffin Basil, MD    No Known Allergies  Social History   Tobacco Use   Smoking status: Never   Smokeless tobacco:  Never  Substance Use Topics   Alcohol use: Yes    Comment: occasionally   Drug use: Not Currently    Family History  Problem Relation Age of Onset   Liver cancer Neg Hx     Objective:   Vitals:   08/16/21 1053  Pulse: 67  Temp: (!) 97.4 F (36.3 C)  SpO2: 100%   Constitutional: in no apparent distress, well developed and well nourished, alert, and oriented times 3 Eyes: anicteric Cardiovascular: Cor RRR Respiratory: normal effort, no distress Gastrointestinal:  non-distended  Musculoskeletal: normal gait Skin: negative for - jaundice, spider hemangioma, telangiectasia, palmar erythema, ecchymosis and atrophy; no porphyria cutanea tarda Lymphatic: no cervical lymphadenopathy   Laboratory: No recent testing     Imaging:  None    Assessment & Plan:   Problem List Items Addressed This Visit       Unprioritized   Positive hepatitis C antibody test - Primary    Ms. Sharpnack is here with a reactive hepatitis C antibody blood test.  No recent risk factors that would support acute infection. Transmission risk could have been from sexual partner > 20 years ago or tattoo.   No reflex quantitative viral load done to confirm active infection. We discussed the differences between tests used to diagnose chronic infection.  Will check CMP, CBC and Hep C RNA quant and notify her of results and further recommendations.   HIV, RPR, Hep B sAg are all non-reactive.        Relevant Orders   Hepatitis C RNA quantitative ([facility])   COMPLETE METABOLIC PANEL WITH GFR   CBC   Trichomonas vaginitis    Partner is getting treatment as well. Counseled to avoid sexual contact for 10-days after they both complete treatment to ensure not back and forth re-infection.  Would prefer the 500 mg BID for 7d course but if she anticipates trouble with missed doses she can do 2 gm (4 tablets) taken once.         Total Encounter Time: 30 minutes spent in direct discussion & in review of available medical records.    Janene Madeira, MSN, NP-C [facility] for Infectious Disease [email or web address] Pager: [phone] Office: [phone] [place]: [phone]

## 2021-08-17 LAB — COMPLETE METABOLIC PANEL WITH GFR
Albumin: 4.1 g/dL (ref 3.6–5.1)
Calcium: 9.1 mg/dL (ref 8.6–10.2)
Creat: 0.6 mg/dL (ref 0.50–0.99)
Globulin: 2.9 g/dL (calc) (ref 1.9–3.7)

## 2021-08-18 LAB — COMPLETE METABOLIC PANEL WITH GFR
AG Ratio: 1.4 (calc) (ref 1.0–2.5)
ALT: 11 U/L (ref 6–29)
AST: 14 U/L (ref 10–30)
Alkaline phosphatase (APISO): 58 U/L (ref 31–125)
BUN: 10 mg/dL (ref 7–25)
CO2: 27 mmol/L (ref 20–32)
Chloride: 106 mmol/L (ref 98–110)
Glucose, Bld: 95 mg/dL (ref 65–99)
Potassium: 3.7 mmol/L (ref 3.5–5.3)
Sodium: 139 mmol/L (ref 135–146)
Total Bilirubin: 0.4 mg/dL (ref 0.2–1.2)
Total Protein: 7 g/dL (ref 6.1–8.1)
eGFR: 114 mL/min/{1.73_m2} (ref >=60)

## 2021-08-18 LAB — CBC
HCT: 30.9 % — ABNORMAL LOW (ref 35.0–45.0)
MCH: 21.6 pg — ABNORMAL LOW (ref 27.0–33.0)
MCHC: 30.1 g/dL — ABNORMAL LOW (ref 32.0–36.0)
MCV: 71.7 fL — ABNORMAL LOW (ref 80.0–100.0)
MPV: 9.8 fL (ref 7.5–12.5)
Platelets: 320 10*3/uL (ref 140–400)
RBC: 4.31 10*6/uL (ref 3.80–5.10)
RDW: 16.6 % — ABNORMAL HIGH (ref 11.0–15.0)
WBC: 3.9 10*3/uL (ref 3.8–10.8)

## 2021-08-18 LAB — HEPATITIS C RNA QUANTITATIVE
HCV Quantitative Log: <1.18 log IU/mL
HCV RNA, PCR, QN: <15 IU/mL

## 2021-09-15 ENCOUNTER — Other Ambulatory Visit: Payer: Self-pay

## 2021-09-15 ENCOUNTER — Other Ambulatory Visit: Payer: 59

## 2021-09-15 ENCOUNTER — Ambulatory Visit: Payer: 59 | Admitting: Infectious Diseases

## 2021-09-15 DIAGNOSIS — R768 Other specified abnormal immunological findings in serum: Secondary | ICD-10-CM

## 2021-09-19 LAB — HEPATITIS C RNA QUANTITATIVE
HCV Quantitative Log: <1.18 log IU/mL
HCV RNA, PCR, QN: <15 IU/mL

## 2021-09-19 LAB — HEPATITIS C ANTIBODY: Hepatitis C Ab: REACTIVE — AB

## 2021-09-20 ENCOUNTER — Encounter (INDEPENDENT_AMBULATORY_CARE_PROVIDER_SITE_OTHER): Payer: 59

## 2021-09-20 DIAGNOSIS — Z712 Person consulting for explanation of examination or test findings: Secondary | ICD-10-CM | POA: Diagnosis not present

## 2021-09-20 NOTE — Telephone Encounter (Signed)
Please see the MyChart message reply(ies) for my assessment and plan.    This patient gave consent for this Medical Advice Message and is aware that it may result in a bill to [employer], as well as the possibility of receiving a bill for a co-payment or deductible. They are an established patient, but are not seeking medical advice exclusively about a problem treated during an in person or video visit in the last seven days. I did not recommend an in person or video visit within seven days of my reply.    I spent a total of 5 minutes cumulative time within 7 days through [employer].  Rexene Alberts, NP

## 2022-09-22 ENCOUNTER — Emergency Department (HOSPITAL_COMMUNITY)
Admission: EM | Admit: 2022-09-22 | Discharge: 2022-09-22 | Disposition: A | Discharge status: Home / Self Care | Payer: Self-pay | Attending: Emergency Medicine | Admitting: Emergency Medicine

## 2022-09-22 ENCOUNTER — Emergency Department (HOSPITAL_COMMUNITY): Payer: Self-pay

## 2022-09-22 DIAGNOSIS — M25511 Pain in right shoulder: Secondary | ICD-10-CM | POA: Insufficient documentation

## 2022-09-22 MED ORDER — OXYCODONE-ACETAMINOPHEN 5-325 MG PO TABS: 1.0000 | ORAL_TABLET | Freq: Four times a day (QID) | ORAL | 0 refills | Status: AC | PRN

## 2022-09-22 MED ORDER — DICLOFENAC SODIUM 50 MG PO TBEC: 50.0000 mg | DELAYED_RELEASE_TABLET | Freq: Two times a day (BID) | ORAL | 0 refills | Status: AC

## 2022-09-22 MED ORDER — OXYCODONE-ACETAMINOPHEN 5-325 MG PO TABS
1.0000 | ORAL_TABLET | Freq: Once | ORAL | Status: AC
  Administered 2022-09-22: 1 via ORAL
  Filled 2022-09-22: qty 1

## 2022-09-22 NOTE — ED Triage Notes (Signed)
Patient reports she injured her right arm 3 months ago, bruised at that time Has been taking ibuprofen Reports pain has been worsening and constant past 3 days Pain rated 7/10

## 2022-09-22 NOTE — Discharge Instructions (Addendum)
Thank you for allowing [place] to be part of your care today.  You were evaluated in the ED for shoulder pain.  Your x-ray did not show evidence of dislocation or fracture.  The joint space of your shoulder is also well-maintained.  Your pain is likely related to inflammation or injury to one of the tendons in your shoulder or bicep.  I am referring you to orthopedics for follow-up.  Please call their office to schedule an appointment.  I am sending you home on a prescription strength anti-inflammatory.  I have also sent in a short course of pain medicine.  Heavy lifting or repetitive motions may aggravate your shoulder pain.  Do not take ibuprofen (Advil) or naproxen (Aleve) while on the prescription anti-inflammatory.    I have included information of other things you can try at home to help with your pain.  Return to the ED if you develop sudden worsening of your symptoms or if you have any new concerns.

## 2022-09-22 NOTE — ED Provider Notes (Signed)
[place] [facility]ERGENCY DEPARTMENT AT [facility] Provider Note   CSN: [id number] Arrival date & time: 09/22/22  1052     History  Chief Complaint  Patient presents with   right shoulder pain    Emily Glass is a 47 y.o. female with past medical history significant for anemia presents to the ED complaining of right-sided shoulder pain.  Patient states that she unknowingly injured her right shoulder 3 months prior and had bruises.  She does not recall a specific injury but remembers getting into the shower and seeing bruises on her upper arm.  Patient states that the pain has been worsening over the past 3 days.  She has been taking ibuprofen with little relief of symptoms.  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  Denies joint swelling, numbness or weakness in the right upper extremity, neck pain.       Home Medications Prior to Admission medications   Medication Sig Start Date End Date Taking? Authorizing Provider  diclofenac (VOLTAREN) 50 MG EC tablet Take 1 tablet (50 mg total) by mouth 2 (two) times daily. 09/22/22  Yes Wayman Hoard R, PA-C  oxyCODONE-acetaminophen (PERCOCET/ROXICET) 5-325 MG tablet Take 1 tablet by mouth every 6 (six) hours as needed for severe pain. 09/22/22  Yes Aryella Besecker R, PA-C  benzonatate (TESSALON) 200 MG capsule Take 1 capsule (200 mg total) by mouth every 8 (eight) hours. Patient not taking: Reported on 08/02/2021 10/20/19   Belinda Fisher, PA-C  fluticasone [facility]) 50 MCG/ACT nasal spray Place 2 sprays into both nostrils daily. Patient not taking: Reported on 08/02/2021 10/20/19   Belinda Fisher, PA-C  ibuprofen (ADVIL,MOTRIN) 200 MG tablet Take 400 mg by mouth every 6 (six) hours as needed. For pain    [provider]  ipratropium (ATROVENT) 0.06 % nasal spray Place 2 sprays into both nostrils 4 (four) times daily. Patient not taking: Reported on 08/02/2021 10/20/19   Belinda Fisher,  PA-C  metroNIDAZOLE (FLAGYL) 500 MG tablet Take 1 tablet (500 mg total) by mouth 2 (two) times daily. 08/09/21   Warden Fillers, MD      Allergies    Patient has no known allergies.    Review of Systems   Review of Systems  Musculoskeletal:  Positive for arthralgias (right shoulder) and myalgias (right upper arm). Negative for joint swelling and neck pain.  Neurological:  Negative for weakness and numbness.    Physical Exam Updated Vital Signs BP (!) 132/97 (BP Location: Left Arm)   Pulse 84   Temp 98.4 F (36.9 C) (Oral)   Resp 16   Ht 5\' 10"  (1.778 m)   Wt 72.6 kg   LMP 09/14/2022   SpO2 100%   BMI 22.96 kg/m  Physical Exam Vitals and nursing note reviewed.  Constitutional:      General: She is not in acute distress.    Appearance: Normal appearance. She is not ill-appearing or diaphoretic.  Cardiovascular:     Rate and Rhythm: Normal rate and regular rhythm.  Pulmonary:     Effort: Pulmonary effort is normal.  Musculoskeletal:     Right shoulder: Tenderness and crepitus (with cross body adduction movement) present. No swelling, deformity or bony tenderness. Decreased range of motion. Normal strength. Normal pulse.     Comments: Decreased active ROM, but normal passive ROM of right shoulder.  Patient has increased pain with movement.  Tenderness  to palpation over long head of biceps tendon and subscapularis of right shoulder.    Neurological:     Mental Status: She is alert. Mental status is at baseline.  Psychiatric:        Mood and Affect: Mood normal.        Behavior: Behavior normal.     ED Results / Procedures / Treatments   Labs (all labs ordered are listed, but only abnormal results are displayed) Labs Reviewed - No data to display  EKG None  Radiology DG Shoulder Right  Result Date: 09/22/2022 CLINICAL DATA:  Right shoulder pain for 3 months EXAM: RIGHT SHOULDER - 2+ VIEW COMPARISON:  None Available. FINDINGS: There is no evidence of fracture or  dislocation. There is no evidence of arthropathy or other focal bone abnormality. Soft tissues are unremarkable. IMPRESSION: Negative. Electronically Signed   By: Duanne Guess D.O.   On: 09/22/2022 12:42    Procedures Procedures    Medications Ordered in ED Medications  oxyCODONE-acetaminophen (PERCOCET/ROXICET) 5-325 MG per tablet 1 tablet (1 tablet Oral Given 09/22/22 1151)    ED Course/ Medical Decision Making/ A&P                             Medical Decision Making Amount and/or Complexity of Data Reviewed Radiology: ordered.  Risk Prescription drug management.   This patient presents to the ED with chief complaint(s) of acute right shoulder pain with non contributory past medical history. The complaint involves an extensive differential diagnosis and also carries with it a high risk of complications and morbidity.    The differential diagnosis includes musculoskeletal sprain or strain, biceps tendinitis, rotator cuff tendinitis, rotator cuff tear, repetitive motion injury   Initial Assessment:   Decreased active ROM, but normal passive ROM of right shoulder.  Patient has increased pain with movement.  Tenderness to palpation over long head of biceps tendon and subscapularis of right shoulder.  Crepitus in the right shoulder appreciated with cross body adduction.  No obvious deformities, bony tenderness, or swelling to right shoulder and upper arm.  Radial pulse is 2+.  No overlying skin changes.   Imaging studies: I ordered and personally interpreted x-ray of right shoulder which showed no evidence of dislocation or fracture.  Joint space is well maintained.  I agree with radiologist interpretation.   Treatment and Reassessment: Patient given PO oxycodone with improvement in pain symptoms.   Disposition:   Patient's x-ray is negative.  Suspect tendinitis or tendon tear.  Will have patient follow up with ortho outpatient.  Patient given short course of pain medicine and  prescription anti-inflammatory to help with symptoms.  Discussed supportive care measures for shoulder pain.  The patient has been appropriately medically screened and/or stabilized in the ED. I have low suspicion for any other emergent medical condition which would require further screening, evaluation or treatment in the ED or require inpatient management. At time of discharge the patient is hemodynamically stable and in no acute distress. I have discussed work-up results and diagnosis with patient and answered all questions. Patient is agreeable with discharge plan. We discussed strict return precautions for returning to the emergency department and they verbalized understanding.            Final Clinical Impression(s) / ED Diagnoses Final diagnoses:  Acute pain of right shoulder    Rx / DC Orders ED Discharge Orders  Ordered    oxyCODONE-acetaminophen (PERCOCET/ROXICET) 5-325 MG tablet  Every 6 hours PRN        09/22/22 1300    diclofenac (VOLTAREN) 50 MG EC tablet  2 times daily        09/22/22 [address] PA-C 09/22/22 1306    Jacalyn Lefevre, MD 09/22/22 1535

## 2022-09-24 ENCOUNTER — Other Ambulatory Visit: Payer: Self-pay | Admitting: Obstetrics and Gynecology

## 2022-09-24 DIAGNOSIS — Z1231 Encounter for screening mammogram for malignant neoplasm of breast: Secondary | ICD-10-CM

## 2022-09-27 ENCOUNTER — Ambulatory Visit: Payer: Self-pay
